# Patient Record
Sex: Male | Born: 2000 | Race: White | Hispanic: No | Marital: Single | Smoking: Never smoker
Health system: Southern US, Community
[De-identification: ages and names within clinical notes are randomized; demographics above are authoritative.]

## PROBLEM LIST (undated history)

## (undated) DIAGNOSIS — C801 Malignant (primary) neoplasm, unspecified: Secondary | ICD-10-CM

## (undated) HISTORY — PX: OTHER SURGICAL HISTORY: SHX169

## (undated) HISTORY — PX: TUMOR REMOVAL: SHX12

---
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
## 2021-01-16 DIAGNOSIS — Z9889 Other specified postprocedural states: Secondary | ICD-10-CM | POA: Diagnosis not present

## 2021-10-18 DIAGNOSIS — F320 Major depressive disorder, single episode, mild: Secondary | ICD-10-CM | POA: Diagnosis not present

## 2021-10-18 DIAGNOSIS — Q600 Renal agenesis, unilateral: Secondary | ICD-10-CM | POA: Diagnosis not present

## 2021-10-18 DIAGNOSIS — C7490 Malignant neoplasm of unspecified part of unspecified adrenal gland: Secondary | ICD-10-CM | POA: Diagnosis not present

## 2021-10-18 DIAGNOSIS — Z0000 Encounter for general adult medical examination without abnormal findings: Secondary | ICD-10-CM | POA: Diagnosis not present

## 2022-09-19 DIAGNOSIS — F320 Major depressive disorder, single episode, mild: Secondary | ICD-10-CM | POA: Diagnosis not present

## 2022-12-19 DIAGNOSIS — F320 Major depressive disorder, single episode, mild: Secondary | ICD-10-CM | POA: Diagnosis not present

## 2023-04-17 DIAGNOSIS — F320 Major depressive disorder, single episode, mild: Secondary | ICD-10-CM | POA: Diagnosis not present

## 2023-10-19 DIAGNOSIS — G4700 Insomnia, unspecified: Secondary | ICD-10-CM | POA: Diagnosis not present

## 2023-10-19 DIAGNOSIS — F320 Major depressive disorder, single episode, mild: Secondary | ICD-10-CM | POA: Diagnosis not present

## 2024-01-19 DIAGNOSIS — Z13220 Encounter for screening for lipoid disorders: Secondary | ICD-10-CM | POA: Diagnosis not present

## 2024-01-19 DIAGNOSIS — F320 Major depressive disorder, single episode, mild: Secondary | ICD-10-CM | POA: Diagnosis not present

## 2024-01-19 DIAGNOSIS — Z0000 Encounter for general adult medical examination without abnormal findings: Secondary | ICD-10-CM | POA: Diagnosis not present

## 2024-01-19 DIAGNOSIS — Z131 Encounter for screening for diabetes mellitus: Secondary | ICD-10-CM | POA: Diagnosis not present

## 2024-02-22 DIAGNOSIS — R7401 Elevation of levels of liver transaminase levels: Secondary | ICD-10-CM | POA: Diagnosis not present
